# Patient Record
Sex: Female | Born: 1980 | Race: White | Hispanic: No | Marital: Married | Smoking: Never smoker
Health system: Southern US, Community
[De-identification: ages and names within clinical notes are randomized; demographics above are authoritative.]

## PROBLEM LIST (undated history)

## (undated) DIAGNOSIS — D693 Immune thrombocytopenic purpura: Secondary | ICD-10-CM

## (undated) DIAGNOSIS — E109 Type 1 diabetes mellitus without complications: Secondary | ICD-10-CM

## (undated) DIAGNOSIS — E0500 Thyrotoxicosis with diffuse goiter without thyrotoxic crisis or storm: Secondary | ICD-10-CM

## (undated) DIAGNOSIS — Z8619 Personal history of other infectious and parasitic diseases: Secondary | ICD-10-CM

## (undated) DIAGNOSIS — E559 Vitamin D deficiency, unspecified: Secondary | ICD-10-CM

## (undated) DIAGNOSIS — R7611 Nonspecific reaction to tuberculin skin test without active tuberculosis: Secondary | ICD-10-CM

## (undated) DIAGNOSIS — L900 Lichen sclerosus et atrophicus: Secondary | ICD-10-CM

## (undated) HISTORY — DX: Immune thrombocytopenic purpura: D69.3

## (undated) HISTORY — DX: Vitamin D deficiency, unspecified: E55.9

## (undated) HISTORY — DX: Thyrotoxicosis with diffuse goiter without thyrotoxic crisis or storm: E05.00

## (undated) HISTORY — DX: Personal history of other infectious and parasitic diseases: Z86.19

## (undated) HISTORY — DX: Nonspecific reaction to tuberculin skin test without active tuberculosis: R76.11

## (undated) HISTORY — DX: Lichen sclerosus et atrophicus: L90.0

## (undated) HISTORY — DX: Type 1 diabetes mellitus without complications: E10.9

---
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
## 2008-04-22 ENCOUNTER — Emergency Department (HOSPITAL_COMMUNITY): Admission: EM | Admit: 2008-04-22 | Discharge: 2008-04-22 | Payer: Self-pay | Admitting: Emergency Medicine

## 2008-05-01 ENCOUNTER — Encounter: Payer: Self-pay | Admitting: Emergency Medicine

## 2008-05-01 ENCOUNTER — Ambulatory Visit (HOSPITAL_COMMUNITY): Admission: RE | Admit: 2008-05-01 | Discharge: 2008-05-01 | Payer: Self-pay | Admitting: Emergency Medicine

## 2008-05-01 ENCOUNTER — Ambulatory Visit: Payer: Self-pay | Admitting: Vascular Surgery
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
## 2009-07-08 ENCOUNTER — Encounter: Admission: RE | Admit: 2009-07-08 | Discharge: 2009-07-08 | Payer: Self-pay | Admitting: Obstetrics & Gynecology

## 2010-07-19 ENCOUNTER — Encounter: Admission: RE | Admit: 2010-07-19 | Discharge: 2010-07-19 | Payer: Self-pay | Admitting: Obstetrics & Gynecology

## 2011-06-15 LAB — DIFFERENTIAL
Basophils Absolute: 0.1
Basophils Relative: 1
Eosinophils Absolute: 0.1
Eosinophils Relative: 1
Lymphocytes Relative: 14
Lymphs Abs: 1.9
Monocytes Absolute: 0.7
Monocytes Relative: 5
Neutro Abs: 11.1 — ABNORMAL HIGH
Neutrophils Relative %: 79 — ABNORMAL HIGH

## 2011-06-15 LAB — CBC
HCT: 49 — ABNORMAL HIGH
Hemoglobin: 17 — ABNORMAL HIGH
MCHC: 34.8
MCV: 92.1
Platelets: ADEQUATE
RBC: 5.32 — ABNORMAL HIGH
RDW: 13
WBC: 13.9 — ABNORMAL HIGH

## 2011-06-15 LAB — COMPREHENSIVE METABOLIC PANEL
ALT: 27
Albumin: 4.1
Alkaline Phosphatase: 96
BUN: 8
Chloride: 101
Potassium: 3.9
Sodium: 135
Total Bilirubin: 0.8
Total Protein: 7.8

## 2011-08-06 ENCOUNTER — Other Ambulatory Visit: Payer: Self-pay | Admitting: Obstetrics & Gynecology

## 2011-08-06 DIAGNOSIS — Z1231 Encounter for screening mammogram for malignant neoplasm of breast: Secondary | ICD-10-CM

## 2011-09-04 ENCOUNTER — Ambulatory Visit
Admission: RE | Admit: 2011-09-04 | Discharge: 2011-09-04 | Disposition: A | Discharge status: Home / Self Care | Payer: Self-pay | Source: Ambulatory Visit | Attending: Obstetrics & Gynecology | Admitting: Obstetrics & Gynecology

## 2011-09-04 DIAGNOSIS — Z1231 Encounter for screening mammogram for malignant neoplasm of breast: Secondary | ICD-10-CM

## 2012-08-08 ENCOUNTER — Other Ambulatory Visit: Payer: Self-pay | Admitting: Obstetrics & Gynecology

## 2012-08-08 DIAGNOSIS — Z803 Family history of malignant neoplasm of breast: Secondary | ICD-10-CM

## 2012-08-08 DIAGNOSIS — Z1231 Encounter for screening mammogram for malignant neoplasm of breast: Secondary | ICD-10-CM

## 2012-09-18 ENCOUNTER — Ambulatory Visit: Payer: Self-pay

## 2012-10-13 ENCOUNTER — Ambulatory Visit
Admission: RE | Admit: 2012-10-13 | Discharge: 2012-10-13 | Disposition: A | Discharge status: Home / Self Care | Payer: Managed Care, Other (non HMO) | Source: Ambulatory Visit | Attending: Obstetrics & Gynecology | Admitting: Obstetrics & Gynecology

## 2012-10-13 DIAGNOSIS — Z803 Family history of malignant neoplasm of breast: Secondary | ICD-10-CM

## 2012-10-13 DIAGNOSIS — Z1231 Encounter for screening mammogram for malignant neoplasm of breast: Secondary | ICD-10-CM

## 2013-05-06 ENCOUNTER — Encounter: Payer: Self-pay | Admitting: Internal Medicine

## 2013-05-06 ENCOUNTER — Ambulatory Visit (INDEPENDENT_AMBULATORY_CARE_PROVIDER_SITE_OTHER): Payer: Managed Care, Other (non HMO) | Admitting: Internal Medicine

## 2013-05-06 VITALS — BP 112/78 | HR 75 | Temp 98.8°F | Ht 65.0 in | Wt 198.0 lb

## 2013-05-06 DIAGNOSIS — E0500 Thyrotoxicosis with diffuse goiter without thyrotoxic crisis or storm: Secondary | ICD-10-CM | POA: Insufficient documentation

## 2013-05-06 DIAGNOSIS — E109 Type 1 diabetes mellitus without complications: Secondary | ICD-10-CM

## 2013-05-06 DIAGNOSIS — E559 Vitamin D deficiency, unspecified: Secondary | ICD-10-CM

## 2013-05-06 DIAGNOSIS — E039 Hypothyroidism, unspecified: Secondary | ICD-10-CM

## 2013-05-06 DIAGNOSIS — Z8639 Personal history of other endocrine, nutritional and metabolic disease: Secondary | ICD-10-CM | POA: Insufficient documentation

## 2013-05-06 DIAGNOSIS — L900 Lichen sclerosus et atrophicus: Secondary | ICD-10-CM | POA: Insufficient documentation

## 2013-05-06 DIAGNOSIS — E11329 Type 2 diabetes mellitus with mild nonproliferative diabetic retinopathy without macular edema: Secondary | ICD-10-CM

## 2013-05-06 DIAGNOSIS — E113299 Type 2 diabetes mellitus with mild nonproliferative diabetic retinopathy without macular edema, unspecified eye: Secondary | ICD-10-CM

## 2013-05-06 DIAGNOSIS — R5381 Other malaise: Secondary | ICD-10-CM | POA: Insufficient documentation

## 2013-05-06 DIAGNOSIS — E785 Hyperlipidemia, unspecified: Secondary | ICD-10-CM

## 2013-05-06 DIAGNOSIS — Z862 Personal history of diseases of the blood and blood-forming organs and certain disorders involving the immune mechanism: Secondary | ICD-10-CM

## 2013-05-06 DIAGNOSIS — L940 Localized scleroderma [morphea]: Secondary | ICD-10-CM

## 2013-05-06 NOTE — Patient Instructions (Signed)
Will notify you  of labs when available. Check into  your network ophthalmologist and see if we need to refer .  Will arrange   Endocrinology reestablishment .    Fatigue is    Multifactorial  Often .   R/o out   Metabolic problem .  May need pneumovax if not done.  At next visit .

## 2013-05-06 NOTE — Progress Notes (Signed)
Chief Complaint  Patient presents with  . Establish Care    Endocrinologist office has closed.    HPI: Patient comes in today for new patient visit to establish. He is originally from [place] and in [place] moved about 5 years ago per family and job.  She has had type 1 diabetes since age 32 years of age and has been under the care of Dr. Leslie Dales however there practice was disbanding and reestablishing and he was not yet credentialed by her insurance. Her last visit was in April and she was due for an A1c and lab tests and thought she would establish with a primary care doctor.   She did not do well on an insulin pump and has been on multiple dose regimen insulin Lantus and Humalog for while she does have a blood sugar since her has been downloaded by her previous physicians. Currently taking 27 Lantus in the morning and 20 5 at night. Note from Tollie Eth IBG's educator reviewed from last visit. Patient states that she tends to run high at night and then low in the morning so doesn't want to over compensate. Avoid taking insulin right before a meal because she doesn't know what her intake will be with small children at home. She can get a low blood sugar about every few weeks or if other things change. She is suggested trying APIDRA a because of post meal dosing however when got the letter about the office closing  ; didn't want to change her insulin at this time.  Denies recent DKA except during pregnancy and developed mild episodes on the insulin,.  She has been seeing Dr. Hazle Quant for eye exams although he maintained P. out of the network now no retinopathy but some microaneurysms. Vision is 20/20  Her A1c  Range Mid to upper 7s range     Number average is 180 and no sx unless get to 40 rare   excpet  preganacy .   Tendency drow over niught    200  And   Hurt knee  recently with going back to biking overuse patellar area And used to exercise     And   Adjusts.   He is  better after physical therapy and a cortisone shot. May be able to get back to exercise.  Concerned about her weight and weight gain.  History of Graves' disease treated with iodine-131 in about 1994 palpable thyroid TSH has been stable on replacement. On generic medication last TSH was done in April 1 0.81  Vitamin D deficiency diagnosed by endocrinologist given 10,000 units to take weekly monitoring noted in record ranging from 35-65 the last one was 32 in April.  She doesn't know she's had a Pneumovax.  She sees a dermatologist Dr. Emily Filbert.  Dr. Smitty Knudsen is her OB/GYN is as diagnosed lichen sclerosus to be using steroid cream diagnosed 2013  Hyperlipidemia on simvastatin for number of years.  History of low platelets felt to be from medication given for her colitis she was on this medicine for almost a year. Symptoms resolved when medication was stopped.  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 ROS: See pertinent positives and negatives per HPI. She recently had an upper respiratory infection that sounds like a sore throat and was given amoxicillin in urgent care is finishing this up think she is getting better no diagnosis given of strep or a specific bacteria. She is on my reading of birth control for about the last year tolerated well.  Past Medical History  Diagnosis Date  . Lichen sclerosus     2013   . Hx of varicella   . Graves disease     I 131 rx about 1994   . ITP (idiopathic thrombocytopenic purpura)     In control since 1996 ? from medication used for treating a colitis since resolved .   Marland Kitchen Type I diabetes mellitus     age 3 years presented with dka  . 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 Vitamin D deficiency     pu ton 10k u per week per endocrine    Family History  Problem Relation Age of Onset  . Cancer Mother 57    Breast  . Hyperlipidemia Mother   . Early death Mother   . Diabetes Mother   . Kidney disease Mother   . Hyperlipidemia Father   .  Pulmonary embolism Father   . Mental illness Brother   . Heart disease Maternal Uncle   . Hyperlipidemia Maternal Grandmother   . Hyperlipidemia Maternal Grandfather   . Hyperlipidemia Paternal Grandmother   . Hyperlipidemia Paternal Grandfather    mom died early deaths type 1 diabetes with renal complications failure and possibly vascular disease was awaiting a renal transplant and had an MI. Father died suddenly at age 25 of a pulmonary embolus was well or other bipolar and is physically well.  History   Social History  . Marital Status: Married    Spouse Name: N/A    Number of Children: N/A  . Years of Education: N/A   Social History Main Topics  . Smoking status: Never Smoker   . Smokeless tobacco: None  . Alcohol Use: Yes  . Drug Use: No  . Sexual Activity: None   Other Topics Concern  . None   Social History Narrative   4 people living in the home married 2 children 5 and 7    Usually receives 6 hours of sleep per night   2 dogs inside dogs.   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   g2 p2   No ets FA                    Outpatient Encounter Prescriptions as of 05/06/2013  Medication Sig Dispense Refill  . amoxicillin-clavulanate (AUGMENTIN) 875-125 MG per tablet       . Cholecalciferol (VITAMIN D PO) Take 10,000 Units by mouth once a week.      . insulin glargine (LANTUS) 100 UNIT/ML injection 27 units q am and 25 in the evening.      . insulin lispro (HUMALOG) 100 UNIT/ML injection Inject into the skin 3 (three) times daily before meals. 1 unit per 5 grams of carb.  1 unit correction per 25.      . levonorgestrel (MIRENA) 20 MCG/24HR IUD 1 each by Intrauterine route once.      Marland Kitchen levothyroxine (SYNTHROID, LEVOTHROID) 112 MCG tablet Take 224 mcg by mouth daily before breakfast.      . simvastatin (ZOCOR) 40 MG tablet Take 40 mg by mouth at bedtime.       No  facility-administered encounter medications on file as of 05/06/2013.    EXAM:  BP 112/78  Pulse 75  Temp(Src) 98.8 F (37.1 C) (Oral)  Ht 5\' 5"  (1.651 m)  Wt 198  lb (89.812 kg)  BMI 32.95 kg/m2  SpO2 98%  Body mass index is 32.95 kg/(m^2).  GENERAL: vitals reviewed and listed above, alert, oriented, appears well hydrated and in no acute distress HEENT: atraumatic, conjunctiva  clear, no obvious abnormalities on inspection of external nose and ears TMs are clear OP : no lesion edema or exudate  NECK: no obvious masses on inspection palpation thyroid is palpable no adenopathy no bruit LUNGS: clear to auscultation bilaterally, no wheezes, rales or rhonchi, good air movement CV: HRRR, no clubbing cyanosis or  peripheral edema nl cap refill  Abdomen soft without hepatomegaly guarding or rebound MS: moves all extremities without noticeable focal  abnormality Nl diabetic foot exam tatoo on right ankle. PSYCH: pleasant and cooperative, no obvious depression or anxiety  ASSESSMENT AND PLAN:  Discussed the following assessment and plan:  Diabetes mellitus type 1 - uses cgm monitoring but not the pump ? mild dka in preg  currently large swings  but a1c in mid 7 range  - Plan: insulin glargine (LANTUS) 100 UNIT/ML injection, insulin lispro (HUMALOG) 100 UNIT/ML injection, levothyroxine (SYNTHROID, LEVOTHROID) 112 MCG tablet, simvastatin (ZOCOR) 40 MG tablet, Cholecalciferol (VITAMIN D PO), levonorgestrel (MIRENA) 20 MCG/24HR IUD, amoxicillin-clavulanate (AUGMENTIN) 875-125 MG per tablet, HM MAMMOGRAPHY, HM PAP SMEAR, Hemoglobin A1c, TSH, Basic metabolic panel, CBC with Differential, Hepatic function panel, Microalbumin / creatinine urine ratio, Ambulatory referral to Endocrinology  Hypothyroidism post ablation - Plan: insulin glargine (LANTUS) 100 UNIT/ML injection, insulin lispro (HUMALOG) 100 UNIT/ML injection, levothyroxine (SYNTHROID, LEVOTHROID) 112 MCG tablet, simvastatin (ZOCOR) 40 MG  tablet, Cholecalciferol (VITAMIN D PO), levonorgestrel (MIRENA) 20 MCG/24HR IUD, amoxicillin-clavulanate (AUGMENTIN) 875-125 MG per tablet, HM MAMMOGRAPHY, HM PAP SMEAR, Hemoglobin A1c, TSH, Basic metabolic panel, CBC with Differential, Hepatic function panel, Microalbumin / creatinine urine ratio, Ambulatory referral to Endocrinology  Other malaise and fatigue - Plan: insulin glargine (LANTUS) 100 UNIT/ML injection, insulin lispro (HUMALOG) 100 UNIT/ML injection, levothyroxine (SYNTHROID, LEVOTHROID) 112 MCG tablet, simvastatin (ZOCOR) 40 MG tablet, Cholecalciferol (VITAMIN D PO), levonorgestrel (MIRENA) 20 MCG/24HR IUD, amoxicillin-clavulanate (AUGMENTIN) 875-125 MG per tablet, HM MAMMOGRAPHY, HM PAP SMEAR, Hemoglobin A1c, TSH, Basic metabolic panel, CBC with Differential, Hepatic function panel, Microalbumin / creatinine urine ratio  Non-proliferative diabetic retinopathy  Vitamin D deficiency - on high dose per endo  Lichen sclerosus  Hx of Graves' disease - rx i 131  Other and unspecified hyperlipidemia Discussion about above monitoring lab done today I advised that she would be best under the care of an endocrinologist in addition to primary care because of her type I status and mobility. Certainly no urgency to it at this time. She has a CGM but don't have a way to downloaded as far as I know into Epic. Advised her to sign up for my chart. referrals to be made uncertain if she has had a Pneumovax are up to date on her immunizations we'll have to review the records.  She has a resolving throat infection or sinus infection unclear at this time but no other intervention needed. -Patient advised to return or notify health care team  if symptoms worsen or persist or new concerns arise.  Patient Instructions  Will notify you  of labs when available. Check into  your network ophthalmologist and see if we need to refer .  Will arrange   Endocrinology reestablishment .    Fatigue is     Multifactorial  Often .   R/o out   Metabolic problem .  May need pneumovax if not  done.  At next visit .   Neta Mends. Favian Kittleson M.D.

## 2013-05-07 LAB — MICROALBUMIN / CREATININE URINE RATIO
Creatinine,U: 189 mg/dL
Microalb, Ur: 1 mg/dL (ref 0.0–1.9)

## 2013-05-07 LAB — BASIC METABOLIC PANEL
BUN: 12 mg/dL (ref 6–23)
CO2: 29 mEq/L (ref 19–32)
Chloride: 101 mEq/L (ref 96–112)
GFR: 84.73 mL/min (ref >60.00)
Glucose, Bld: 158 mg/dL — ABNORMAL HIGH (ref 70–99)
Potassium: 3.8 mEq/L (ref 3.5–5.1)
Sodium: 135 mEq/L (ref 135–145)

## 2013-05-07 LAB — CBC WITH DIFFERENTIAL/PLATELET
Basophils Absolute: 0 10*3/uL (ref 0.0–0.1)
HCT: 45.4 % (ref 36.0–46.0)
Hemoglobin: 15.6 g/dL — ABNORMAL HIGH (ref 12.0–15.0)
Lymphs Abs: 2.6 10*3/uL (ref 0.7–4.0)
MCV: 93.4 fl (ref 78.0–100.0)
Monocytes Absolute: 0.5 10*3/uL (ref 0.1–1.0)
Monocytes Relative: 5.3 % (ref 3.0–12.0)
Neutro Abs: 6.7 10*3/uL (ref 1.4–7.7)
Platelets: 361 10*3/uL (ref 150.0–400.0)
RDW: 12.4 % (ref 11.5–14.6)

## 2013-05-07 LAB — HEPATIC FUNCTION PANEL
ALT: 17 U/L (ref 0–35)
AST: 18 U/L (ref 0–37)
Albumin: 3.9 g/dL (ref 3.5–5.2)
Total Bilirubin: 0.3 mg/dL (ref 0.3–1.2)

## 2013-05-13 ENCOUNTER — Other Ambulatory Visit: Payer: Self-pay | Admitting: Family Medicine

## 2013-05-13 DIAGNOSIS — E039 Hypothyroidism, unspecified: Secondary | ICD-10-CM

## 2013-05-13 DIAGNOSIS — E109 Type 1 diabetes mellitus without complications: Secondary | ICD-10-CM

## 2013-05-15 ENCOUNTER — Other Ambulatory Visit: Payer: Self-pay | Admitting: Family Medicine

## 2013-05-15 MED ORDER — LEVOTHYROXINE SODIUM 200 MCG PO TABS: 200.0000 ug | ORAL_TABLET | Freq: Every day | ORAL | Status: DC

## 2013-05-20 ENCOUNTER — Encounter: Payer: Self-pay | Admitting: Endocrinology

## 2013-07-30 ENCOUNTER — Other Ambulatory Visit: Payer: Managed Care, Other (non HMO)

## 2013-07-30 ENCOUNTER — Other Ambulatory Visit (INDEPENDENT_AMBULATORY_CARE_PROVIDER_SITE_OTHER): Payer: Managed Care, Other (non HMO)

## 2013-07-30 DIAGNOSIS — E109 Type 1 diabetes mellitus without complications: Secondary | ICD-10-CM

## 2013-07-30 DIAGNOSIS — E039 Hypothyroidism, unspecified: Secondary | ICD-10-CM

## 2013-07-30 LAB — TSH: TSH: 0.3 u[IU]/mL — ABNORMAL LOW (ref 0.35–5.50)

## 2013-08-04 ENCOUNTER — Ambulatory Visit (INDEPENDENT_AMBULATORY_CARE_PROVIDER_SITE_OTHER): Payer: Managed Care, Other (non HMO) | Admitting: Internal Medicine

## 2013-08-04 ENCOUNTER — Encounter: Payer: Self-pay | Admitting: Internal Medicine

## 2013-08-04 VITALS — BP 120/74 | HR 93 | Temp 98.8°F | Wt 200.0 lb

## 2013-08-04 DIAGNOSIS — Z8639 Personal history of other endocrine, nutritional and metabolic disease: Secondary | ICD-10-CM

## 2013-08-04 DIAGNOSIS — E109 Type 1 diabetes mellitus without complications: Secondary | ICD-10-CM

## 2013-08-04 DIAGNOSIS — E039 Hypothyroidism, unspecified: Secondary | ICD-10-CM

## 2013-08-04 DIAGNOSIS — Z862 Personal history of diseases of the blood and blood-forming organs and certain disorders involving the immune mechanism: Secondary | ICD-10-CM

## 2013-08-04 MED ORDER — INSULIN GLULISINE 100 UNIT/ML SOLOSTAR PEN: 22.0000 [IU] | PEN_INJECTOR | Freq: Every day | SUBCUTANEOUS | Status: DC

## 2013-08-04 NOTE — Patient Instructions (Addendum)
The thyroid is still a bit over suppressed and recheck tsh in about 3 months before change dose again.  Check in insurance  Before seeing endo again.  Look into again any pneumonia vaccine/  Pneumovax . Will [facility]   [facility]   Actually was printed for pt  .

## 2013-08-04 NOTE — Progress Notes (Signed)
Chief Complaint  Patient presents with  . Follow-up    dm thyroid    HPI: Patient comes in today for follow up of  multiple medical problems.  DM BGs  are good in am and elevated PP   Shot for bkfast down at lunch and  lantus  And humalogue   Peaking  To 300 on a scale for prandial insulin 4 x per day   hadn't  Been to endo yet insurance changes network etc  Prev dr Altheimer who is not in single practice . No sx serious lows  Seem to have PP highs No change in thyroid prep or pill or practice in taking  ROS: See pertinent positives and negatives per HPI. No numbness weakness vision changes no ever new eye sx    Past Medical History  Diagnosis Date  . Lichen sclerosus     2013   . Hx of varicella   . Graves disease     I 131 rx about 1994   . ITP (idiopathic thrombocytopenic purpura)     In control since 1996 ? from medication used for treating a colitis since resolved .   Marland Kitchen Type I diabetes mellitus     age 26 years presented with dka  . 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 Vitamin D deficiency     pu ton 10k u per week per endocrine    Family History  Problem Relation Age of Onset  . Cancer Mother 50    Breast  . Hyperlipidemia Mother   . Early death Mother   . Diabetes Mother   . Kidney disease Mother   . Hyperlipidemia Father   . Pulmonary embolism Father   . Mental illness Brother   . Heart disease Maternal Uncle   . Hyperlipidemia Maternal Grandmother   . Hyperlipidemia Maternal Grandfather   . Hyperlipidemia Paternal Grandmother   . Hyperlipidemia Paternal Grandfather     History   Social History  . Marital Status: Married    Spouse Name: N/A    Number of Children: N/A  . Years of Education: N/A   Social History Main Topics  . Smoking status: Never Smoker   . Smokeless tobacco: None  . Alcohol Use: Yes  . Drug Use: No  . Sexual Activity: None   Other Topics Concern  . None   Social History Narrative   4 people living in the home married 2  children 5 and 7    Usually receives 6 hours of sleep per night   2 dogs inside dogs.   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   g2 p2   No ets FA              Outpatient Encounter Prescriptions as of 08/04/2013  Medication Sig  . Cholecalciferol (VITAMIN D PO) Take 10,000 Units by mouth once a week.  . insulin glargine (LANTUS) 100 UNIT/ML injection 27 units q am and 25 in the evening.  . insulin lispro (HUMALOG) 100 UNIT/ML injection Inject into the skin 3 (three) times daily before meals. 1 unit per 5 grams of carb.  1 unit correction per 25.  . levonorgestrel (MIRENA) 20 MCG/24HR IUD 1 each by Intrauterine route once.  Marland Kitchen levothyroxine (SYNTHROID) 200 MCG tablet Take 200 mcg by mouth daily before breakfast.  . simvastatin (ZOCOR)  40 MG tablet Take 40 mg by mouth at bedtime.  . [DISCONTINUED] levothyroxine (SYNTHROID, LEVOTHROID) 112 MCG tablet Take 224 mcg by mouth daily before breakfast.  . Insulin Glulisine 100 UNIT/ML SOPN Inject 22-30 Units into the skin daily.  . [DISCONTINUED] amoxicillin-clavulanate (AUGMENTIN) 875-125 MG per tablet   . [DISCONTINUED] levothyroxine (SYNTHROID, LEVOTHROID) 200 MCG tablet Take 1 tablet (200 mcg total) by mouth daily before breakfast.    EXAM:  BP 120/74  Pulse 93  Temp(Src) 98.8 F (37.1 C) (Oral)  Wt 200 lb (90.719 kg)  SpO2 98%  Body mass index is 33.28 kg/(m^2).  GENERAL: vitals reviewed and listed above, alert, oriented, appears well hydrated and in no acute distress look s well  MS: moves all extremities without noticeable focal  abnormality No foot problem reported  PSYCH: pleasant and cooperative, no obvious depression or anxiety Lab Results  Component Value Date   WBC 10.1 05/06/2013   HGB 15.6* 05/06/2013   HCT 45.4 05/06/2013   PLT 361.0 05/06/2013   GLUCOSE 158* 05/06/2013   ALT 17 05/06/2013   AST 18 05/06/2013   NA 135  05/06/2013   K 3.8 05/06/2013   CL 101 05/06/2013   CREATININE 0.8 05/06/2013   BUN 12 05/06/2013   CO2 29 05/06/2013   TSH 0.30* 07/30/2013   HGBA1C 9.1* 07/30/2013   MICROALBUR 1.0 05/06/2013    ASSESSMENT AND PLAN:  Discussed the following assessment and plan:  Hypothyroidism  Hx of Graves' disease  Type I diabetes mellitus tsh still over suppressed  Dm still not at goal  adivsed endo  Fu. As  No apidra samples but this might be worth a try  rx given   Uncertain about PNeumo vaccine  But  Was in reg care so could be UTD  Fasting sugars reported as at goal .  Counseled. -Patient advised to return or notify health care team  if symptoms worsen or persist or new concerns arise.  Patient Instructions  The thyroid is still a bit over suppressed and recheck tsh in about 3 months before change dose again.  Check in insurance  Before seeing endo again.  Look into again any pneumonia vaccine/  Pneumovax . Will send [facility]   [facility]   Actually was printed for pt  .  Neta Mends. Panosh M.D.

## 2013-08-06 ENCOUNTER — Encounter: Payer: Self-pay | Admitting: Internal Medicine

## 2013-08-24 ENCOUNTER — Other Ambulatory Visit: Payer: Self-pay | Admitting: Family Medicine

## 2013-08-24 ENCOUNTER — Telehealth: Payer: Self-pay | Admitting: Internal Medicine

## 2013-08-24 MED ORDER — INSULIN GLULISINE 100 UNIT/ML SOLOSTAR PEN: 22.0000 [IU] | PEN_INJECTOR | Freq: Every day | SUBCUTANEOUS | Status: AC

## 2013-08-24 NOTE — Telephone Encounter (Signed)
Pt lost rx for apidra. Please call rx into [facility] on [address]

## 2013-08-24 NOTE — Telephone Encounter (Signed)
Sent to the pharmacy by e-scribe. 

## 2013-08-24 NOTE — Telephone Encounter (Signed)
I gave he a rx to use untill she sees endocrinologist  We didnt have any samples   it is on her med list.  . ( glulisine)

## 2013-08-24 NOTE — Telephone Encounter (Signed)
I don't see that you are prescribing this medication.  Please advise.  Thanks!

## 2013-09-18 ENCOUNTER — Telehealth (INDEPENDENT_AMBULATORY_CARE_PROVIDER_SITE_OTHER): Payer: Self-pay

## 2013-09-18 NOTE — Telephone Encounter (Signed)
Opened in error.  No documentation.

## 2013-10-05 ENCOUNTER — Other Ambulatory Visit: Payer: Self-pay | Admitting: Internal Medicine

## 2013-10-05 ENCOUNTER — Telehealth: Payer: Self-pay | Admitting: Family Medicine

## 2013-10-05 DIAGNOSIS — E0500 Thyrotoxicosis with diffuse goiter without thyrotoxic crisis or storm: Secondary | ICD-10-CM

## 2013-10-05 DIAGNOSIS — E039 Hypothyroidism, unspecified: Secondary | ICD-10-CM

## 2013-10-05 NOTE — Telephone Encounter (Signed)
This patient needs a TSH in February.  I have placed the order in the system.  Please schedule with the patient.  Thanks!

## 2013-10-05 NOTE — Telephone Encounter (Signed)
Pt does not need lab appt. Pt will be going back to her endocrinologist

## 2013-10-18 ENCOUNTER — Other Ambulatory Visit: Payer: Self-pay | Admitting: Oncology

## 2013-11-02 ENCOUNTER — Other Ambulatory Visit: Payer: Self-pay

## 2013-11-02 DIAGNOSIS — Z1231 Encounter for screening mammogram for malignant neoplasm of breast: Secondary | ICD-10-CM

## 2013-11-17 ENCOUNTER — Ambulatory Visit
Admission: RE | Admit: 2013-11-17 | Discharge: 2013-11-17 | Disposition: A | Discharge status: Home / Self Care | Payer: Self-pay | Source: Ambulatory Visit

## 2013-11-17 DIAGNOSIS — Z1231 Encounter for screening mammogram for malignant neoplasm of breast: Secondary | ICD-10-CM

## 2013-12-08 LAB — LIPID PANEL: LDL Cholesterol: 113 mg/dL

## 2013-12-08 LAB — HEMOGLOBIN A1C: HEMOGLOBIN A1C: 7 % — AB (ref 4.0–6.0)

## 2014-04-15 ENCOUNTER — Encounter: Payer: Self-pay | Admitting: Internal Medicine

## 2014-06-01 LAB — LIPID PANEL
Cholesterol: 165 mg/dL (ref 0–200)
HDL: 47 mg/dL (ref 35–70)
LDL CALC: 102 mg/dL
Triglycerides: 81 mg/dL (ref 40–160)

## 2014-06-14 ENCOUNTER — Encounter: Payer: Self-pay | Admitting: Internal Medicine

## 2014-07-19 ENCOUNTER — Encounter: Payer: Self-pay | Admitting: Internal Medicine

## 2014-09-30 ENCOUNTER — Other Ambulatory Visit: Payer: Self-pay

## 2014-09-30 DIAGNOSIS — Z1231 Encounter for screening mammogram for malignant neoplasm of breast: Secondary | ICD-10-CM

## 2014-09-30 DIAGNOSIS — Z803 Family history of malignant neoplasm of breast: Secondary | ICD-10-CM

## 2014-11-19 ENCOUNTER — Ambulatory Visit
Admission: RE | Admit: 2014-11-19 | Discharge: 2014-11-19 | Disposition: A | Discharge status: Home / Self Care | Payer: 59 | Source: Ambulatory Visit

## 2014-11-19 DIAGNOSIS — Z803 Family history of malignant neoplasm of breast: Secondary | ICD-10-CM

## 2014-11-19 DIAGNOSIS — Z1231 Encounter for screening mammogram for malignant neoplasm of breast: Secondary | ICD-10-CM
